# Patient Record
Sex: Female | Born: 2000 | Race: White | Hispanic: No | Marital: Single | Smoking: Never smoker
Health system: Southern US, Community
[De-identification: ages and names within clinical notes are randomized; demographics above are authoritative.]

---
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
## 2021-08-07 ENCOUNTER — Encounter: Payer: Self-pay | Admitting: Cardiology

## 2021-08-07 ENCOUNTER — Ambulatory Visit: Payer: 59 | Admitting: Cardiology

## 2021-08-07 ENCOUNTER — Other Ambulatory Visit: Payer: Self-pay

## 2021-08-07 VITALS — BP 109/71 | HR 85 | Temp 98.0°F | Resp 16 | Ht 66.0 in | Wt 226.6 lb

## 2021-08-07 DIAGNOSIS — R000 Tachycardia, unspecified: Secondary | ICD-10-CM

## 2021-08-07 NOTE — Progress Notes (Signed)
Primary Physician/Referring:  Toy Baker, DO  Patient ID: Barbara Donaldson, female    DOB: [birth year], 21 y.o.   MRN: [id number]  Chief Complaint  Patient presents with   Tachycardia   New Patient (Initial Visit)   HPI:    Barbara Donaldson  is a 21 y.o. Caucasian female patient with no significant prior cardiovascular history, on 07/27/2021 was in the grocery store when she started noticing palpitations.  She went to her car as she got flustered and anxious and sat there for 15 minutes or so, recorded a heart rate of 240 bpm on her apple watch.  Following day she was taken to the urgent care when she was asymptomatic, she had normal exam and normal EKG but referred for outpatient follow-up.  She has not had any further episodes of palpitations.   History reviewed. No pertinent past medical history. History reviewed. No pertinent surgical history. Family History  Problem Relation Age of Onset   Hyperlipidemia Father    Hypertension Father     Social History   Tobacco Use   Smoking status: Never   Smokeless tobacco: Never  Substance Use Topics   Alcohol use: Never   Marital Status: Single  ROS  Review of Systems  Constitutional: Positive for weight gain.  Cardiovascular:  Positive for dyspnea on exertion. Negative for chest pain and leg swelling.  Gastrointestinal:  Negative for melena.  Objective  Blood pressure 109/71, pulse 85, temperature 98 F (36.7 C), temperature source Temporal, resp. rate 16, height 5\' 6"  (1.676 m), weight 226 lb 9.6 oz (102.8 kg), SpO2 98 %. Body mass index is 36.57 kg/m.  Vitals with BMI 08/07/2021  Height 5\' 6"   Weight 226 lbs 10 oz  BMI 36.59  Systolic 109  Diastolic 71  Pulse 85    Physical Exam Constitutional:      Appearance: She is obese.  Neck:     Vascular: No carotid bruit or JVD.  Cardiovascular:     Rate and Rhythm: Normal rate and regular rhythm.     Pulses: Intact distal pulses.     Heart sounds: Normal heart  sounds. No murmur heard.   No gallop.  Pulmonary:     Effort: Pulmonary effort is normal.     Breath sounds: Normal breath sounds.  Abdominal:     General: Bowel sounds are normal.     Palpations: Abdomen is soft.  Musculoskeletal:        General: No swelling.     Laboratory examination:    External labs:   NA Medications and allergies   Allergies  Allergen Reactions   Clindamycin Dermatitis, Rash and Swelling    Facial swelling Facial swelling      Medication prior to this encounter:   Outpatient Medications Prior to Visit  Medication Sig Dispense Refill   Cholecalciferol 125 MCG (5000 UT) TABS Take 1 tablet by mouth daily.     Iron-FA-B Cmp-C-Biot-Probiotic (FUSION PLUS) CAPS Take 1 tablet by mouth daily.     Melatonin 3 MG TBDP Take 1 tablet by mouth as needed.     methylphenidate (RITALIN) 10 MG tablet Take 10-20 mg by mouth daily.     methylphenidate 54 MG PO CR tablet Take 54 mg by mouth every morning.     Sertraline HCl 150 MG CAPS Take 1 capsule by mouth daily.     No facility-administered medications prior to visit.     Medication list after today's encounter   Current Outpatient  Medications  Medication Instructions   Cholecalciferol 125 MCG (5000 UT) TABS 1 tablet, Oral, Daily   Iron-FA-B Cmp-C-Biot-Probiotic (FUSION PLUS) CAPS 1 tablet, Oral, Daily   Melatonin 3 MG TBDP 1 tablet, Oral, As needed   methylphenidate (CONCERTA) 54 mg, Oral, Every morning   methylphenidate (RITALIN) 10-20 mg, Oral, Daily   Sertraline HCl 150 MG CAPS 1 capsule, Oral, Daily    Radiology:   No results found.  Cardiac Studies:   NA EKG:   EKG 08/07/2021: Normal sinus rhythm with rate of 78 bpm.  Normal EKG.    Assessment     ICD-10-CM   1. Tachycardia  R00.0 EKG 12-Lead    PCV ECHOCARDIOGRAM COMPLETE       There are no discontinued medications.  No orders of the defined types were placed in this encounter.  Orders Placed This Encounter  Procedures   EKG  12-Lead   PCV ECHOCARDIOGRAM COMPLETE    Standing Status:   Future    Standing Expiration Date:   08/07/2022   Recommendations:   Barbara Donaldson is a 21 y.o. Caucasian female patient with no significant prior cardiovascular history, on 07/27/2021 was in the grocery store when she started noticing palpitations.  She went to her car as she got flustered and anxious and sat there for 15 minutes or so, recorded a heart rate of 240 bpm on her apple watch.  Following day she was taken to the urgent care when she was asymptomatic, she had normal exam and normal EKG but referred for outpatient follow-up.  She has not had any further episodes of palpitations.  I do not suspect SVT.  For the past 1 year she has gained significant amount of weight of >40+ pounds and suspect deconditioning to be part of the etiology.  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  Given normal exam and normal EKG and symptoms suggestive of sinus tachycardia only, do not think she needs any event monitoring as she has not had any further episodes.  Watchful waiting is indicated.  In view of dyspnea and palpitations, will obtain an echocardiogram.  Unless echocardiogram is abnormal, I will see her back on a as needed basis.  Weight loss counseling was performed.  Her mother was very pleased about the open and frank discussions we have had with her regarding weight loss, college and studies, patient is interested in going to psychology.  Patient has had TSH and other labs performed told to be within normal limits recently.  She follows up with Dr. Clance Boll with regard to GYN needs.    Barbara Decamp, MD, [facility] 08/07/2021, 3:13 PM Office: [phone]

## 2021-08-09 ENCOUNTER — Ambulatory Visit: Payer: 59

## 2021-08-09 ENCOUNTER — Other Ambulatory Visit: Payer: Self-pay

## 2021-08-09 DIAGNOSIS — R000 Tachycardia, unspecified: Secondary | ICD-10-CM

## 2021-08-10 NOTE — Progress Notes (Signed)
Labs 12/20/2020: ° °Hb 15.2/HCT 44.0, platelets 222, normal indicis. ° °Serum glucose 93 mg, BUN 15, creatinine 0.88, EGFR 97 mL, AST elevated at 52, ALT elevated at 76. ° °Total cholesterol 145, triglycerides 80, HDL 40, LDL 89. ° °TSH normal at 1.07. A1c 5.2%.  Vitamin D 26.4.
